# Patient Record
Sex: Female | Born: 1995 | Race: Black or African American | Hispanic: No | Marital: Married | Smoking: Never smoker
Health system: Southern US, Community
[De-identification: ages and names within clinical notes are randomized; demographics above are authoritative.]

## PROBLEM LIST (undated history)

## (undated) DIAGNOSIS — Z789 Other specified health status: Secondary | ICD-10-CM

## (undated) HISTORY — PX: NO PAST SURGERIES: SHX2092

---
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
## 2014-11-05 IMAGING — US US OB COMP +14 WK
1 series · 12 of 28 positions shown · non-contrast
Comparison: none

[Series 1: us ob comp +14 wk · 12 of 77 slices shown]
[im 3/77]
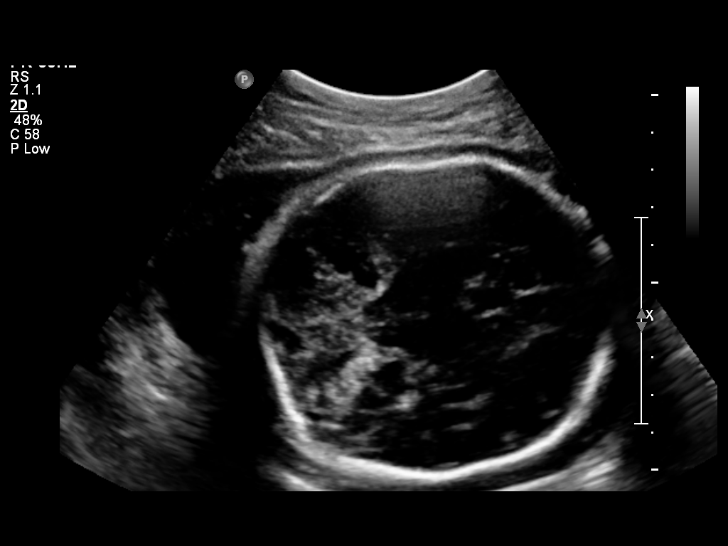
[im 9/77]
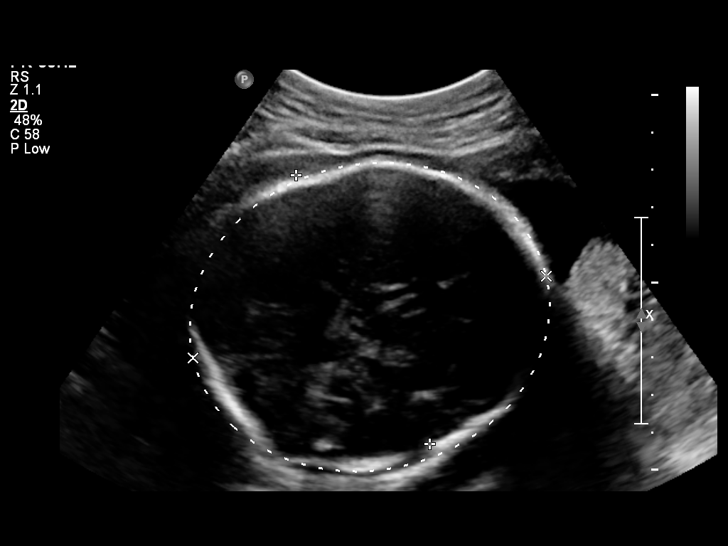
[im 15/77]
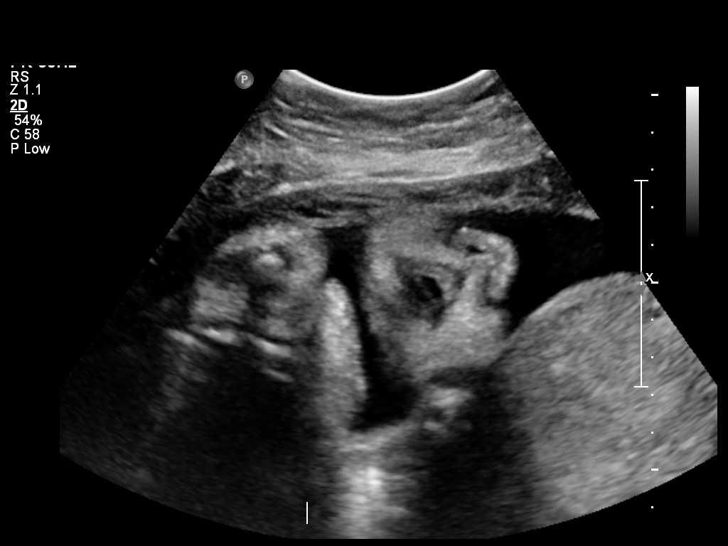
[im 23/77]
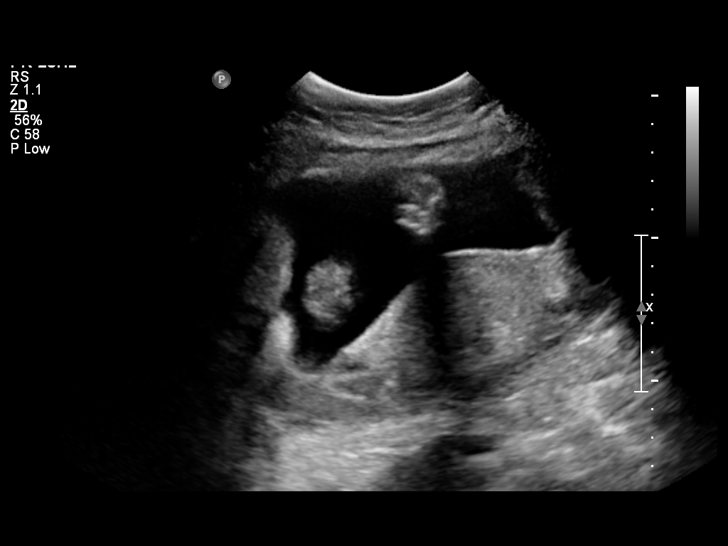
[im 29/77]
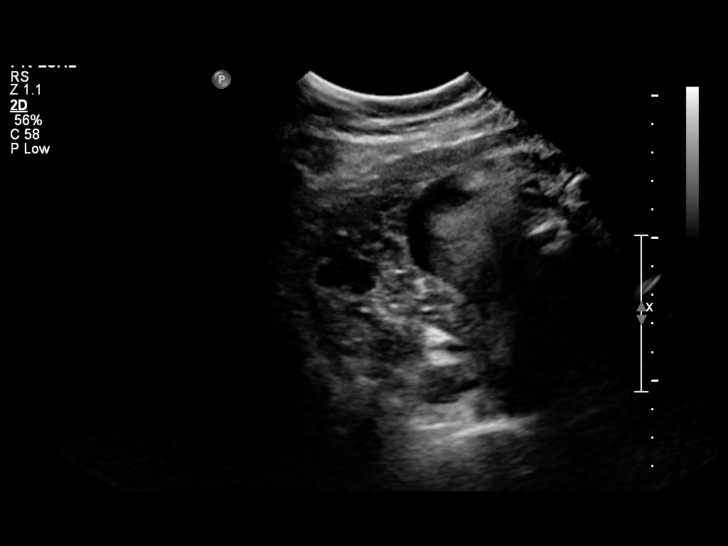
[im 34/77]
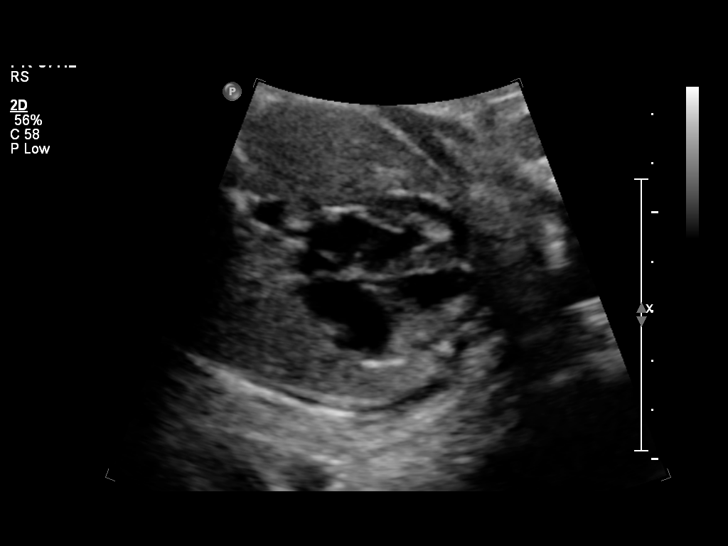
[im 43/77]
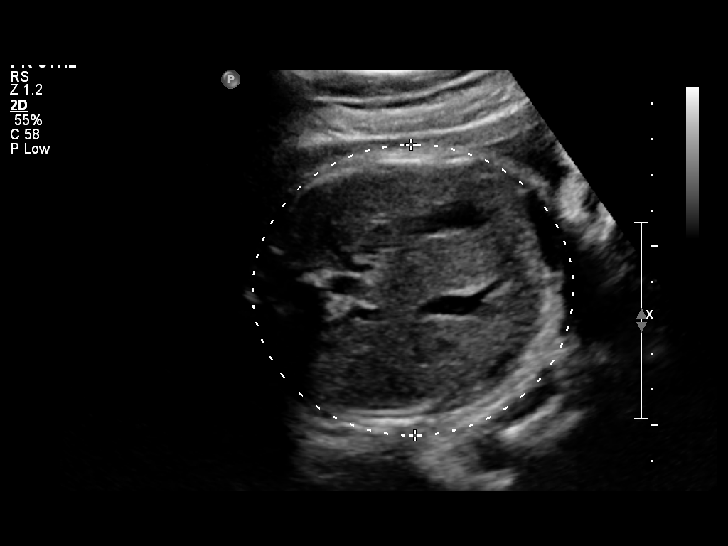
[im 48/77]
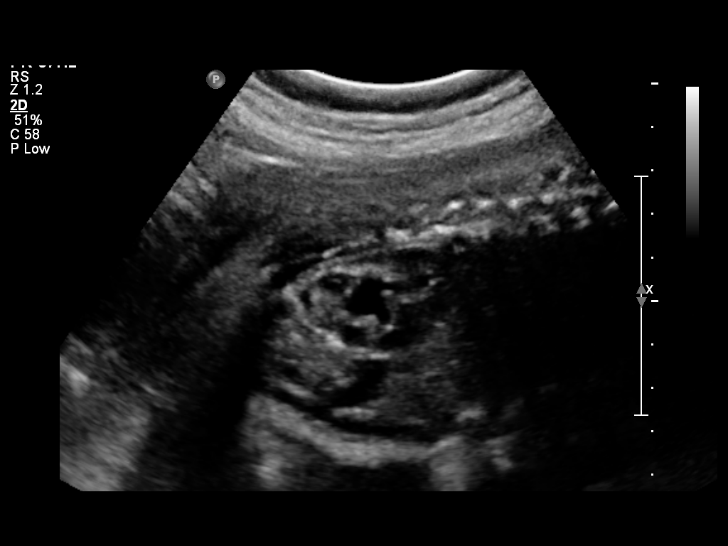
[im 54/77]
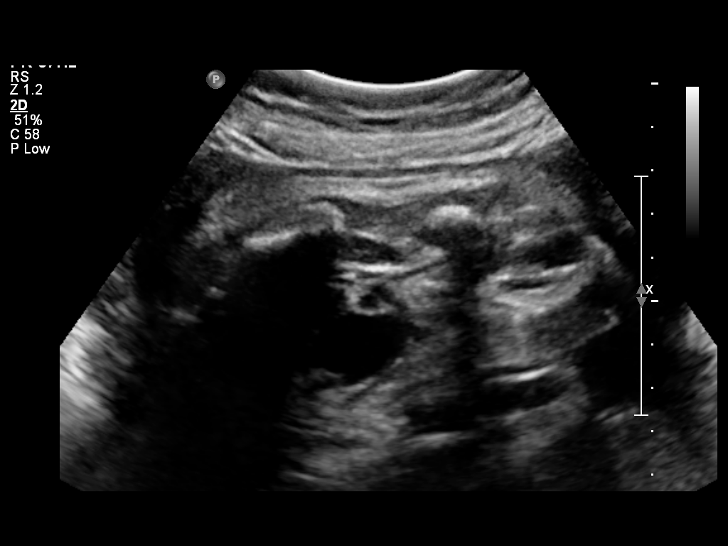
[im 62/77]
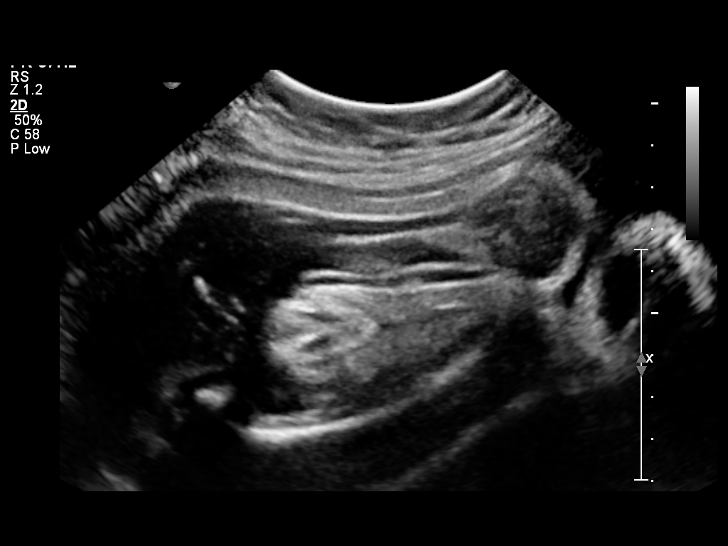
[im 68/77]
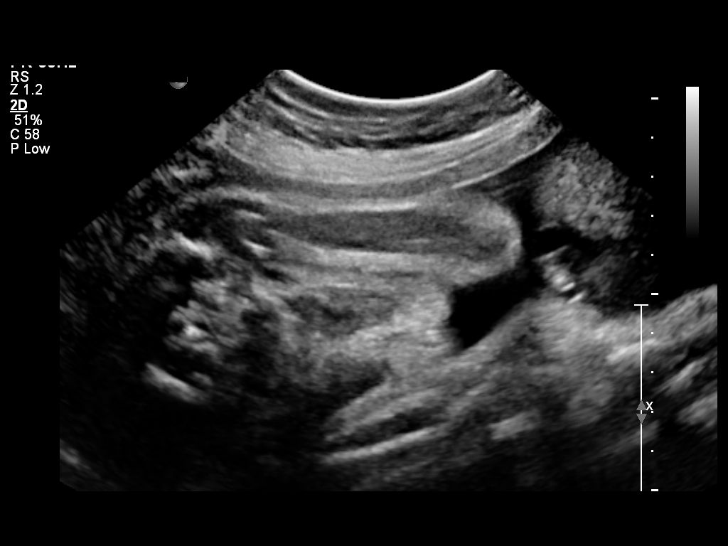
[im 74/77]
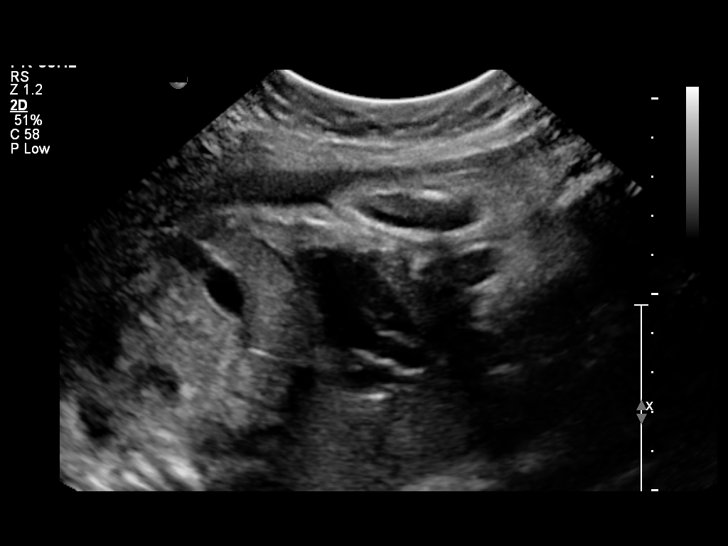

[12 of 28 positions shown; findings below may reference images not displayed]

OBSTETRICS REPORT
                      (Signed Final 03/18/2013 [DATE])

Service(s) Provided

 US OB COMP + 14 WK                                    76805.1
Indications

 Unsure of LMP;  Establish Gestational [AGE]
 Basic anatomic survey
 No or Little Prenatal Care
Fetal Evaluation

 Num Of Fetuses:    1
 Fetal Heart Rate:  152                          bpm
 Cardiac Activity:  Observed
 Presentation:      Cephalic
 Placenta:          Posterior, above cervical
                    os
 P. Cord            Visualized, central
 Insertion:

 Amniotic Fluid
 AFI FV:      Subjectively within normal limits
 AFI Sum:     13.64   cm       44  %Tile     Larg Pckt:    3.99  cm
 RUQ:   2.85    cm   RLQ:    3.99   cm    LUQ:   3.27    cm   LLQ:    3.53   cm
Biometry

 BPD:     80.2  mm     G. Age:  32w 1d                CI:        74.47   70 - 86
                                                      FL/HC:      20.8   19.1 -

 HC:       295  mm     G. Age:  32w 4d       37  %    HC/AC:      1.12   0.96 -

 AC:     263.4  mm     G. Age:  30w 3d       16  %    FL/BPD:     76.6   71 - 87
 FL:      61.4  mm     G. Age:  31w 6d       41  %    FL/AC:      23.3   20 - 24
 HUM:     54.7  mm     G. Age:  31w 6d       53  %
 Est. FW:    2039  gm    3 lb 13 oz      47  %
Gestational Age

 LMP:           29w 0d        Date:  08/27/12                 EDD:   06/03/13
 U/S Today:     31w 5d                                        EDD:   05/15/13
 Best:          31w 5d     Det. By:  U/S (03/18/13)           EDD:   05/15/13
Anatomy
 Cranium:          Appears normal         Aortic Arch:      Not well visualized
 Fetal Cavum:      Appears normal         Ductal Arch:      Not well visualized
 Ventricles:       Appears normal         Diaphragm:        Appears normal
 Choroid Plexus:   Not well visualized    Stomach:          Appears normal, left
                                                            sided
 Cerebellum:       Not well visualized    Abdomen:          Appears normal
 Posterior Fossa:  Appears normal         Abdominal Wall:   Not well visualized
 Nuchal Fold:      Not applicable (>20    Cord Vessels:     Appears normal (3
                   wks GA)                                  vessel cord)
 Face:             Not well visualized    Kidneys:          Right sided
                                                            pyelectasis,  8 mm
 Lips:             Appears normal         Bladder:          Appears normal
 Heart:            Appears normal         Spine:            Not well visualized
                   (4CH, axis, and
                   situs)
 RVOT:             Appears normal         Lower             Visualized
                                          Extremities:
 LVOT:             Appears normal         Upper             Visualized
                                          Extremities:

 Other:  Fetus appears to be a male. Technically difficult due to advanced
         gestational age. Technically difficult due to fetal position.
Cervix Uterus Adnexa

 Cervix:       Normal appearance by transabdominal scan.
 Uterus:       No abnormality visualized.
 Cul De Sac:   No free fluid seen.

 Left Ovary:    Not visualized. No adnexal mass visualized.
 Right Ovary:   Not visualized. No adnexal mass visualized.
 Adnexa:     No abnormality visualized.
Impression

 Single IUP at 31 [DATE] weeks
 Right sided renal pylecatsis (8 mm) noted.  The left kidney
 appears normal
 Fetal growth is appropriate (47th %tile)
 Somewhat limited views of the anatomy were obtained due to
 late gestational age - the fetal face was not well visualized
 Posterior placenta without previa
 Normal amniotic fluid volume
Recommendations

 Notifiy Peds at time of delivery - recommend imaging studies
 of the newborn's kidneys after delivery.
 Otherwise, follow-up ultrasounds as clinically indicated.

## 2018-06-11 ENCOUNTER — Ambulatory Visit (HOSPITAL_COMMUNITY)
Admission: EM | Admit: 2018-06-11 | Discharge: 2018-06-11 | Disposition: A | Discharge status: Home / Self Care | Payer: Medicaid Other | Attending: Family Medicine | Admitting: Family Medicine

## 2018-06-11 ENCOUNTER — Encounter (HOSPITAL_COMMUNITY): Payer: Self-pay

## 2018-06-11 DIAGNOSIS — L84 Corns and callosities: Secondary | ICD-10-CM | POA: Diagnosis not present

## 2018-06-11 DIAGNOSIS — M79671 Pain in right foot: Secondary | ICD-10-CM

## 2018-06-11 NOTE — Discharge Instructions (Addendum)
See foot specialist for the calluses

## 2018-06-11 NOTE — ED Triage Notes (Signed)
Pt c/o pain to the bottom of her feet for over a month, worse when standing at work. Dry skin noted.  Pt also c/o dry cough since Monday. Denies fever

## 2018-06-11 NOTE — ED Provider Notes (Signed)
[facility]    CSN: [id number] Arrival date & time: 06/11/18  1543     History   Chief Complaint Chief Complaint  Patient presents with  . Foot Pain    HPI Leslie Miles is a 23 y.o. female.   HPI  Feet hurt many  Months, worse with new job at [employer], sports   On feet long time each day.  Wears sneakers.  No trauma. Pain worse at end of the day Also "clering throat from PND with slight cough for 4 days.  No fever, chills, body aches.  Denies exposure COVID  Seen with Somali interpreter Past Medical History:  Diagnosis Date  . Medical history non-contributory     Patient Active Problem List   Diagnosis Date Noted  . NSVD (normal spontaneous vaginal delivery) 05/06/2013  . Labor and delivery, indication for care 05/04/2013  . Fetal pyelectasis 03/19/2013  . Thrombocytopenia complicating pregnancy ([place]) 03/09/2013  . Language barrier, cultural differences 12/22/2012  . Late prenatal care complicating pregnancy in second trimester 12/22/2012    Past Surgical History:  Procedure Laterality Date  . NO PAST SURGERIES      OB History    Gravida  1   Para  1   Term      Preterm  1   AB      Living  1     SAB      TAB      Ectopic      Multiple      Live Births  1            Home Medications    Prior to Admission medications   Medication Sig Start Date End Date Taking? Authorizing Provider  ferrous sulfate 325 (65 FE) MG tablet Take 1 tablet (325 mg total) by mouth 2 (two) times daily with a meal. 05/06/13   Karamalegos, Devonne Doughty, DO  ibuprofen (ADVIL,MOTRIN) 600 MG tablet Take 1 tablet (600 mg total) by mouth every 6 (six) hours. 05/06/13   Olin Hauser, DO    Family History No family history on file.  Social History Social History   Tobacco Use  . Smoking status: Never Smoker  . Smokeless tobacco: Never Used  Substance Use Topics  . Alcohol use: No  . Drug use: No     Allergies   Patient  has no known allergies.   Review of Systems Review of Systems  Constitutional: Negative for chills and fever.  HENT: Positive for postnasal drip. Negative for ear pain and sore throat.   Eyes: Negative for pain and visual disturbance.  Respiratory: Positive for cough. Negative for shortness of breath.   Cardiovascular: Negative for chest pain and palpitations.  Gastrointestinal: Negative for abdominal pain and vomiting.  Genitourinary: Negative for dysuria and hematuria.  Musculoskeletal: Positive for gait problem. Negative for arthralgias and back pain.  Skin: Negative for color change and rash.  Neurological: Negative for seizures and syncope.  All other systems reviewed and are negative.    Physical Exam Triage Vital Signs ED Triage Vitals [06/11/18 1603]  Enc Vitals Group     BP (!) 172/116     Pulse Rate 65     Resp 18     Temp 98 F (36.7 C)     Temp Source Oral     SpO2 99 %     Weight      Height      Head Circumference  Peak Flow      Pain Score 10     Pain Loc      Pain Edu?      Excl. in [place]?    No data found.  Updated Vital Signs BP 124/83 (BP Location: Left Arm)   Pulse 65   Temp 98 F (36.7 C) (Oral)   Resp 18   SpO2 99%   Visual Acuity Right Eye Distance:   Left Eye Distance:   Bilateral Distance:    Right Eye Near:   Left Eye Near:    Bilateral Near:     Physical Exam Constitutional:      General: She is not in acute distress.    Appearance: She is well-developed and normal weight.  HENT:     Head: Normocephalic and atraumatic.  Eyes:     Conjunctiva/sclera: Conjunctivae normal.     Pupils: Pupils are equal, round, and reactive to light.  Neck:     Musculoskeletal: Normal range of motion.  Cardiovascular:     Rate and Rhythm: Normal rate and regular rhythm.     Heart sounds: Normal heart sounds.  Pulmonary:     Effort: Pulmonary effort is normal. No respiratory distress.     Breath sounds: Normal breath sounds.  Abdominal:      General: There is no distension.     Palpations: Abdomen is soft.  Musculoskeletal: Normal range of motion.       Feet:  Skin:    General: Skin is warm and dry.  Neurological:     Mental Status: She is alert.  Psychiatric:        Mood and Affect: Mood normal.        Behavior: Behavior normal.      UC Treatments / Results  Labs (all labs ordered are listed, but only abnormal results are displayed) Labs Reviewed - No data to display  EKG None  Radiology No results found.  Procedures Procedures (including critical care time)  Medications Ordered in UC Medications - No data to display  Initial Impression / Assessment and Plan / UC Course  I have reviewed the triage vital signs and the nursing notes.  Pertinent labs & imaging results that were available during my care of the patient were reviewed by me and considered in my medical decision making (see chart for details).     Final Clinical Impressions(s) / UC Diagnoses   Final diagnoses:  Callus of foot  Bilateral foot pain     Discharge Instructions     See foot specialist for the calluses     ED Prescriptions    None     Controlled Substance Prescriptions [place] Controlled Substance Registry consulted? Not Applicable   Raylene Everts, MD 06/11/18 [phone]

## 2018-06-16 ENCOUNTER — Encounter: Payer: Self-pay | Admitting: Podiatry

## 2018-06-16 ENCOUNTER — Other Ambulatory Visit: Payer: Self-pay

## 2018-06-16 ENCOUNTER — Ambulatory Visit: Payer: Medicaid Other | Admitting: Podiatry

## 2018-06-16 VITALS — BP 119/92 | HR 88

## 2018-06-16 DIAGNOSIS — D2372 Other benign neoplasm of skin of left lower limb, including hip: Secondary | ICD-10-CM | POA: Diagnosis not present

## 2018-06-16 DIAGNOSIS — L989 Disorder of the skin and subcutaneous tissue, unspecified: Secondary | ICD-10-CM

## 2018-06-21 NOTE — Progress Notes (Signed)
   Subjective: 23 year old female presenting today as a new patient with a chief complaint of painful lesions noted to the plantar aspects of the feet bilaterally that appeared about three months ago. She states the areas are callused and cause a burning sensation when ambulating in shoes. She has not done anything for treatment. Patient is here for further evaluation and treatment.   Past Medical History:  Diagnosis Date  . Medical history non-contributory      Objective:  Physical Exam General: Alert and oriented x3 in no acute distress  Dermatology: Hyperkeratotic lesions present on the bilateral feet. Pain on palpation with a central nucleated core noted. Skin is warm, dry and supple bilateral lower extremities. Negative for open lesions or macerations.  Vascular: Palpable pedal pulses bilaterally. No edema or erythema noted. Capillary refill within normal limits.  Neurological: Epicritic and protective threshold grossly intact bilaterally.   Musculoskeletal Exam: Pain on palpation at the keratotic lesions noted. Range of motion within normal limits bilateral. Muscle strength 5/5 in all groups bilateral.  Assessment: 1. Pre-ulcerative callus lesions noted bilaterally x 5   Plan of Care:  1. Patient evaluated 2. Excisional debridement of keratoic lesion using a chisel blade was performed without incident.  3. Dressed area with light dressing. 4. Recommended Urea 40% cream.  5. Note for work provided.  6. Patient is to return to the clinic PRN.   From [name]. Works at a [employer], [employer] systems.   Edrick Kins, DPM [facility]  Dr. Edrick Kins, [address]                                        [place], [place] [address]                Office [phone]  Fax [phone]
